# Patient Record
Sex: Male | Born: 1960 | Hispanic: No | Marital: Married | Smoking: Never smoker
Health system: Southern US, Community
[De-identification: ages and names within clinical notes are randomized; demographics above are authoritative.]

---

## 1999-04-03 ENCOUNTER — Encounter: Payer: Self-pay | Admitting: Emergency Medicine

## 1999-04-03 ENCOUNTER — Inpatient Hospital Stay (HOSPITAL_COMMUNITY): Admission: EM | Admit: 1999-04-03 | Discharge: 1999-04-03 | Payer: Self-pay | Admitting: Emergency Medicine

## 1999-11-01 ENCOUNTER — Emergency Department (HOSPITAL_COMMUNITY): Admission: EM | Admit: 1999-11-01 | Discharge: 1999-11-01 | Payer: Self-pay | Admitting: Emergency Medicine

## 1999-11-01 ENCOUNTER — Encounter: Payer: Self-pay | Admitting: Emergency Medicine

## 2011-07-14 ENCOUNTER — Telehealth: Payer: Self-pay | Admitting: *Deleted

## 2011-07-14 NOTE — Telephone Encounter (Signed)
Opened in error

## 2019-02-26 ENCOUNTER — Emergency Department (HOSPITAL_COMMUNITY)
Admission: EM | Admit: 2019-02-26 | Discharge: 2019-02-26 | Disposition: A | Discharge status: Home / Self Care | Payer: Worker's Compensation | Attending: Emergency Medicine | Admitting: Emergency Medicine

## 2019-02-26 ENCOUNTER — Emergency Department (HOSPITAL_COMMUNITY): Payer: Worker's Compensation

## 2019-02-26 ENCOUNTER — Encounter (HOSPITAL_COMMUNITY): Payer: Self-pay

## 2019-02-26 DIAGNOSIS — S0081XA Abrasion of other part of head, initial encounter: Secondary | ICD-10-CM | POA: Insufficient documentation

## 2019-02-26 DIAGNOSIS — Z23 Encounter for immunization: Secondary | ICD-10-CM | POA: Diagnosis not present

## 2019-02-26 DIAGNOSIS — Y999 Unspecified external cause status: Secondary | ICD-10-CM | POA: Insufficient documentation

## 2019-02-26 DIAGNOSIS — Y9389 Activity, other specified: Secondary | ICD-10-CM | POA: Diagnosis not present

## 2019-02-26 DIAGNOSIS — Y92812 Truck as the place of occurrence of the external cause: Secondary | ICD-10-CM | POA: Insufficient documentation

## 2019-02-26 DIAGNOSIS — S42402A Unspecified fracture of lower end of left humerus, initial encounter for closed fracture: Secondary | ICD-10-CM | POA: Diagnosis not present

## 2019-02-26 DIAGNOSIS — W1789XA Other fall from one level to another, initial encounter: Secondary | ICD-10-CM | POA: Diagnosis not present

## 2019-02-26 MED ORDER — TRAMADOL HCL 50 MG PO TABS: 50.0000 mg | ORAL_TABLET | Freq: Four times a day (QID) | ORAL | 0 refills | Status: AC | PRN

## 2019-02-26 MED ORDER — TETANUS-DIPHTH-ACELL PERTUSSIS 5-2.5-18.5 LF-MCG/0.5 IM SUSP
0.5000 mL | Freq: Once | INTRAMUSCULAR | Status: AC
  Administered 2019-02-26: 0.5 mL via INTRAMUSCULAR
  Filled 2019-02-26: qty 0.5

## 2019-02-26 MED ORDER — OXYCODONE-ACETAMINOPHEN 5-325 MG PO TABS
1.0000 | ORAL_TABLET | Freq: Once | ORAL | Status: AC
  Administered 2019-02-26: 1 via ORAL
  Filled 2019-02-26: qty 1

## 2019-02-26 NOTE — Progress Notes (Signed)
Orthopedic Tech Progress Note Patient Details:  Eugene Willis [birth year] [id number]  Ortho Devices Type of Ortho Device: Arm sling, Post (long arm) splint Ortho Device/Splint Location: lue Ortho Device/Splint Interventions: Ordered, Application, Adjustment   Post Interventions Patient Tolerated: Well Instructions Provided: Care of device, Adjustment of device   Trinna Post 02/26/2019, 6:19 AM

## 2019-02-26 NOTE — ED Triage Notes (Signed)
Pt comes via [facility] after a fall out of tractor trailer from appx 6 ft onto his head, abrasions on L side of head, pt refused c-collar, pt was initially hypotensive at 74 systolic palpated, c/o of L arm pain, not on blood thinners, no LOC, PTA received 200 mcg fentanyl

## 2019-02-26 NOTE — ED Provider Notes (Signed)
[facility] EMER[facility]ENC[facility] DEPARTMENT Provider Note   CSN: [id number] Arrival date & time: 02/26/19  0342   History Chief Complaint  Patient presents with  . Fall    Eugene Willis is a 59 y.o. male.  The history is provided by the patient and the EMS personnel.  Fall  He fell getting out of a truck.  The fall was estimated about 6 feet and he landed on his left elbow.  He suffered abrasions to the left side of his face.  There was no loss of consciousness.  He denies other injury.  He does not know when his last tetanus immunization was.  EMS noted hypotension initially with blood pressure 74 systolic.  Patient was pale and lightheaded at that time.  Blood pressure has normalized since then.  He specifically denies neck, back, chest, abdomen injury.   No past medical history on file.  There are no problems to display for this patient.   ** The histories are not reviewed yet. Please review them in the "History" navigator section and refresh this Webb.     No family history on file.  Social History   Tobacco Use  . Smoking status: Not on file  Substance Use Topics  . Alcohol use: Not on file  . Drug use: Not on file    Home Medications Prior to Admission medications   Not on File    Allergies    Patient has no known allergies.  Review of Systems   Review of Systems  All other systems reviewed and are negative.   Physical Exam Updated Vital Signs BP (!) 188/97   Pulse 61   Temp 98.7 F (37.1 C) (Oral)   Resp (!) 21   SpO2 99%   Physical Exam Vitals and nursing note reviewed.   59 year old male, resting comfortably and in no acute distress. Vital signs are significant for elevated blood pressure and borderline elevated respiratory rate. Oxygen saturation is 99%, which is normal. Head is normocephalic.  Abrasions are present in the left infraorbital area and lateral to the left orbit without discrete laceration. PERRLA, EOMI.  Oropharynx is clear.  Neck is nontender without adenopathy or JVD. Back is nontender and there is no CVA tenderness. Lungs are clear without rales, wheezes, or rhonchi. Chest is nontender. Heart has regular rate and rhythm without murmur. Abdomen is soft, flat, nontender without masses or hepatosplenomegaly and peristalsis is normoactive. Pelvis is stable and nontender. Extremities: There is tenderness to palpation in the left elbow and he has pain with extension of the elbow and with pronation/supination.  There is no swelling or deformity seen.  Distal neurovascular exam is intact with strong pulses, prompt capillary refill, normal sensation.  There is no tenderness palpation anywhere else in the left arm and there is full range of motion present in the left shoulder and left wrist.  Full range of motion present all other joints without pain. Skin is warm and dry without rash. Neurologic: Mental status is normal, cranial nerves are intact, there are no motor or sensory deficits.  ED Results / Procedures / Treatments    Radiology DG Elbow Complete Left  Result Date: 02/26/2019 CLINICAL DATA:  Fall. Pain. EXAM: LEFT ELBOW - COMPLETE 3+ VIEW COMPARISON:  None. FINDINGS: Study markedly limited due to patient positioning/inability to extend elbow. Within this limitation, no gross fracture or dislocation is evident. Small cortical fragments adjacent to the medial epicondyle suggest avulsion injury and there is associated overlying  soft tissue swelling. Lateral projection is oblique in position, but demonstrates no fat pad elevation to suggest joint effusion. IMPRESSION: Limited study due to positioning shows small cortical fragments adjacent to the medial epicondyle compatible with avulsion injury. No gross fracture or dislocation. No joint effusion evident although assessment limited by oblique positioning on the lateral film. Electronically Signed   By: Kennith Center M.D.   On: 02/26/2019 04:30    CT Head Wo Contrast  Result Date: 02/26/2019 CLINICAL DATA:  Patient fell 6 feet out of a truck onto his head. EXAM: CT HEAD WITHOUT CONTRAST CT MAXILLOFACIAL WITHOUT CONTRAST CT CERVICAL SPINE WITHOUT CONTRAST TECHNIQUE: Multidetector CT imaging of the head, cervical spine, and maxillofacial structures were performed using the standard protocol without intravenous contrast. Multiplanar CT image reconstructions of the cervical spine and maxillofacial structures were also generated. COMPARISON:  None. FINDINGS: CT HEAD FINDINGS Brain: There is no evidence for acute hemorrhage, hydrocephalus, mass lesion, or abnormal extra-axial fluid collection. No definite CT evidence for acute infarction. Vascular: No hyperdense vessel or unexpected calcification. Skull: Normal. Negative for fracture or focal lesion. Other: None. CT MAXILLOFACIAL FINDINGS Osseous: No fracture or mandibular dislocation. No destructive process. Orbits: Negative. No traumatic or inflammatory finding. Sinuses: Minimal chronic mucosal thickening noted both maxillary sinuses with some fluid visible in the inferior right mastoid air cells. Soft tissues: Negative. CT CERVICAL SPINE FINDINGS Alignment: Normal. Skull base and vertebrae: No acute fracture. No primary bone lesion or focal pathologic process. Soft tissues and spinal canal: No prevertebral fluid or swelling. No visible canal hematoma. Disc levels:  Mild loss of disc height noted C6-7. Upper chest: Negative. Other: None. IMPRESSION: 1. Normal CT evaluation of the brain. 2. No evidence for an acute maxillofacial fracture. 3. No cervical spine fracture. Electronically Signed   By: Kennith Center M.D.   On: 02/26/2019 05:17   CT Cervical Spine Wo Contrast  Result Date: 02/26/2019 CLINICAL DATA:  Patient fell 6 feet out of a truck onto his head. EXAM: CT HEAD WITHOUT CONTRAST CT MAXILLOFACIAL WITHOUT CONTRAST CT CERVICAL SPINE WITHOUT CONTRAST TECHNIQUE: Multidetector CT imaging of the head,  cervical spine, and maxillofacial structures were performed using the standard protocol without intravenous contrast. Multiplanar CT image reconstructions of the cervical spine and maxillofacial structures were also generated. COMPARISON:  None. FINDINGS: CT HEAD FINDINGS Brain: There is no evidence for acute hemorrhage, hydrocephalus, mass lesion, or abnormal extra-axial fluid collection. No definite CT evidence for acute infarction. Vascular: No hyperdense vessel or unexpected calcification. Skull: Normal. Negative for fracture or focal lesion. Other: None. CT MAXILLOFACIAL FINDINGS Osseous: No fracture or mandibular dislocation. No destructive process. Orbits: Negative. No traumatic or inflammatory finding. Sinuses: Minimal chronic mucosal thickening noted both maxillary sinuses with some fluid visible in the inferior right mastoid air cells. Soft tissues: Negative. CT CERVICAL SPINE FINDINGS Alignment: Normal. Skull base and vertebrae: No acute fracture. No primary bone lesion or focal pathologic process. Soft tissues and spinal canal: No prevertebral fluid or swelling. No visible canal hematoma. Disc levels:  Mild loss of disc height noted C6-7. Upper chest: Negative. Other: None. IMPRESSION: 1. Normal CT evaluation of the brain. 2. No evidence for an acute maxillofacial fracture. 3. No cervical spine fracture. Electronically Signed   By: Kennith Center M.D.   On: 02/26/2019 05:17   CT Maxillofacial Wo Contrast  Result Date: 02/26/2019 CLINICAL DATA:  Patient fell 6 feet out of a truck onto his head. EXAM: CT HEAD WITHOUT CONTRAST CT MAXILLOFACIAL WITHOUT  CONTRAST CT CERVICAL SPINE WITHOUT CONTRAST TECHNIQUE: Multidetector CT imaging of the head, cervical spine, and maxillofacial structures were performed using the standard protocol without intravenous contrast. Multiplanar CT image reconstructions of the cervical spine and maxillofacial structures were also generated. COMPARISON:  None. FINDINGS: CT HEAD  FINDINGS Brain: There is no evidence for acute hemorrhage, hydrocephalus, mass lesion, or abnormal extra-axial fluid collection. No definite CT evidence for acute infarction. Vascular: No hyperdense vessel or unexpected calcification. Skull: Normal. Negative for fracture or focal lesion. Other: None. CT MAXILLOFACIAL FINDINGS Osseous: No fracture or mandibular dislocation. No destructive process. Orbits: Negative. No traumatic or inflammatory finding. Sinuses: Minimal chronic mucosal thickening noted both maxillary sinuses with some fluid visible in the inferior right mastoid air cells. Soft tissues: Negative. CT CERVICAL SPINE FINDINGS Alignment: Normal. Skull base and vertebrae: No acute fracture. No primary bone lesion or focal pathologic process. Soft tissues and spinal canal: No prevertebral fluid or swelling. No visible canal hematoma. Disc levels:  Mild loss of disc height noted C6-7. Upper chest: Negative. Other: None. IMPRESSION: 1. Normal CT evaluation of the brain. 2. No evidence for an acute maxillofacial fracture. 3. No cervical spine fracture. Electronically Signed   By: Kennith Center M.D.   On: 02/26/2019 05:17   Procedures .Splint Application  Date/Time: 02/26/2019 5:37 AM Performed by: Dione Booze, MD Authorized by: Dione Booze, MD   Consent:    Consent obtained:  Verbal   Consent given by:  Patient   Risks discussed:  Pain, numbness and swelling   Alternatives discussed:  No treatment Pre-procedure details:    Sensation:  Normal   Skin color:  Normal Procedure details:    Laterality:  Left   Location:  Arm   Arm:  L upper arm and L lower arm   Strapping: no     Splint type:  Long arm   Supplies:  Ortho-Glass, cotton padding and elastic bandage Post-procedure details:    Pain:  Unchanged   Sensation:  Normal   Skin color:  Normal   Patient tolerance of procedure:  Tolerated well, no immediate complications Comments:     Splint was applied by orthopedic technician.  I  evaluated the patient's neurovascular status following splint application.     Medications Ordered in ED Medications  Tdap (BOOSTRIX) injection 0.5 mL (has no administration in time range)    ED Course  I have reviewed the triage vital signs and the nursing notes.  Pertinent imaging results that were available during my care of the patient were reviewed by me and considered in my medical decision making (see chart for details).  MDM Rules/Calculators/A&P Fall from cab of truck with injury to left elbow and facial abrasions.  He will be sent for x-rays of left elbow and CT scans of head, maxillofacial bones, cervical spine.  Tdap booster is given.  Old records are reviewed, and he has no relevant past visits.  CT scans show no significant head, face, neck injury.  X-rays of the left elbow appear to show avulsion fractures of the medial condyle.  He is placed in a long-arm splint and will be referred to orthopedics for follow-up.  He will be discharged with prescription for tramadol.  Final Clinical Impression(s) / ED Diagnoses Final diagnoses:  Fall from stationary vehicle, initial encounter  Abrasion of face, initial encounter  Closed fracture of left elbow, initial encounter    Rx / DC Orders ED Discharge Orders    None  Dione Booze, MD 02/26/19 [phone]

## 2019-02-26 NOTE — Discharge Instructions (Signed)
Wear the splint and use the sling until you see the orthopedic doctor.  Apply ice as needed.  Apply antibiotic ointment to the abraded areas on your face.  Take acetaminophen or ibuprofen as needed for pain. For more severe pain, you can take tramadol.

## 2019-02-28 ENCOUNTER — Other Ambulatory Visit: Payer: Self-pay

## 2019-02-28 ENCOUNTER — Encounter: Payer: Self-pay | Admitting: Orthopaedic Surgery

## 2019-02-28 ENCOUNTER — Ambulatory Visit: Payer: Managed Care, Other (non HMO) | Admitting: Orthopaedic Surgery

## 2019-02-28 DIAGNOSIS — S5002XA Contusion of left elbow, initial encounter: Secondary | ICD-10-CM | POA: Diagnosis not present

## 2019-02-28 NOTE — Progress Notes (Signed)
Office Visit Note   Patient: Eugene Willis           Date of Birth: [birth year]           MRN: [id number] Visit Date: 02/28/2019              Requested by: No referring provider defined for this encounter. PCP: Patient, No Pcp Per   Assessment & Plan: Visit Diagnoses:  1. Contusion of left elbow, initial encounter     Plan: Given his x-ray findings and clinical exam findings he can be out of the splint but I would like him to still use a sling.  He can gently work on range of motion of his elbow.  I would like to see him back next week for repeat 3 views of the left elbow.  He can work on ice and elevation for swelling.  I will need to keep him out of work until further notice.  All question concerns were answered and addressed.  Follow-Up Instructions: Return in about 8 days (around 03/08/2019).   Orders:  No orders of the defined types were placed in this encounter.  No orders of the defined types were placed in this encounter.     Procedures: No procedures performed   Clinical Data: No additional findings.   Subjective: Chief Complaint  Patient presents with  . Left Elbow - Fracture  The patient is a very pleasant right-hand-dominant 59 year old gentleman who was referred for emergency room after slipping off the second step of a truck landing on his left elbow and injuring his scalp and head.  He was found to have some type of elbow injury so the ER staff appropriately placed him in a splint and is following up with our office today.  He denies any numbness and tingling in his hand but does report elbow pain and swelling.  He has a family member with him today to help with interpreting although he does speak pretty good broken [place].  I was able to review the chart from emergency room and see pictures and x-rays as well.  He had significant contusions and abrasions to his head.  He is never injured this arm before or had surgery on his arm.  HPI  Review of Systems  He currently denies any shortness of breath, he denies any chest pain, fever, chills, nausea, vomiting  Objective: Vital Signs: There were no vitals taken for this visit.  Physical Exam He is alert and orient x3 and in no acute distress Ortho Exam Examination of his left elbow out of the splint shows significant bruising and swelling globally around the elbow.  It is clinically well located but painful throughout its arc of motion.  His shoulder and hand exam showed no gross findings other than swelling around the hand from being in a splint for a few days. Specialty Comments:  No specialty comments available.  Imaging: No results found. X-rays on the canopy system of the elbow elbow show questionable small avulsions off of the medial epicondyle of the elbow but the elbow is located.  These cortical irregularities are very small.  PMFS History: There are no problems to display for this patient.  History reviewed. No pertinent past medical history.  History reviewed. No pertinent family history.  History reviewed. No pertinent surgical history. Social History   Occupational History  . Not on file  Tobacco Use  . Smoking status: Never Smoker  . Smokeless tobacco: Never Used  Substance and Sexual Activity  .  Alcohol use: Never  . Drug use: Never  . Sexual activity: Not on file

## 2019-03-07 ENCOUNTER — Other Ambulatory Visit: Payer: Self-pay | Admitting: Physician Assistant

## 2019-03-07 ENCOUNTER — Telehealth: Payer: Self-pay | Admitting: Orthopaedic Surgery

## 2019-03-07 MED ORDER — HYDROCODONE-ACETAMINOPHEN 5-325 MG PO TABS: 1.0000 | ORAL_TABLET | ORAL | 0 refills | Status: AC | PRN

## 2019-03-07 NOTE — Telephone Encounter (Signed)
Please advise 

## 2019-03-07 NOTE — Telephone Encounter (Signed)
Patient's daughter called.   They are requesting a stronger prescription for the patient. She says they need a higher dosage to hold them until the appointment as her father is in extreme pain   Call back number: [phone]

## 2019-03-07 NOTE — Telephone Encounter (Signed)
Patient called  He need a prescription for TraMADol   Call back number: [phone]

## 2019-03-07 NOTE — Telephone Encounter (Signed)
Can you please advise I sent a message earlier  to [facility] for him to see tomorrow but patient states he can't wait that long, he is in pain Please advise His daughter ask's that if we don't change it, can we atleast refill his Tramadol

## 2019-03-08 ENCOUNTER — Ambulatory Visit: Payer: Managed Care, Other (non HMO) | Admitting: Physician Assistant

## 2020-08-11 IMAGING — CT CT CERVICAL SPINE W/O CM
3 of 4 series · 12 of 33 positions shown, 14 images · non-contrast
Comparison: None.

CLINICAL DATA: Patient fell 6 feet out of a truck onto his head.

EXAM:
CT HEAD WITHOUT CONTRAST
CT MAXILLOFACIAL WITHOUT CONTRAST
CT CERVICAL SPINE WITHOUT CONTRAST
TECHNIQUE: Multidetector CT imaging of the head, cervical spine, and
maxillofacial structures were performed using the standard protocol
without intravenous contrast. Multiplanar CT image reconstructions
of the cervical spine and maxillofacial structures were also
generated.

[Series 8: sag bone · sagittal · 0.37mm/px · 5 of 120 slices shown, 6 images]
[im 40/120  bone]
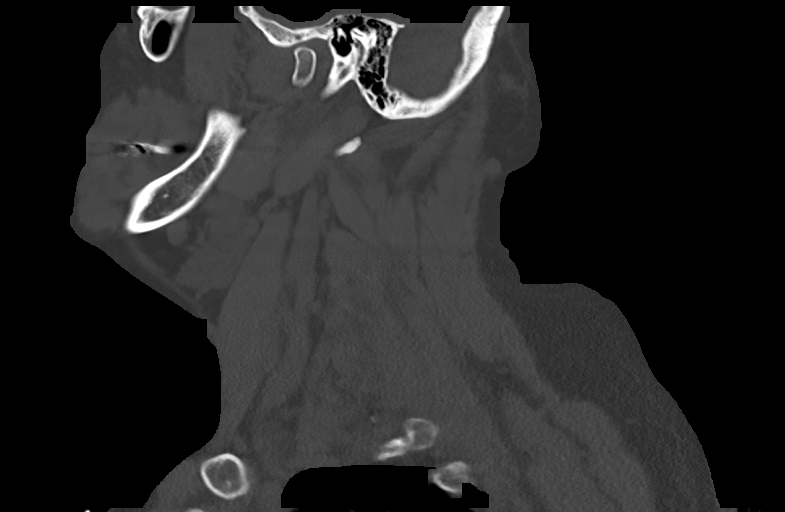
[im 50/120  bone]
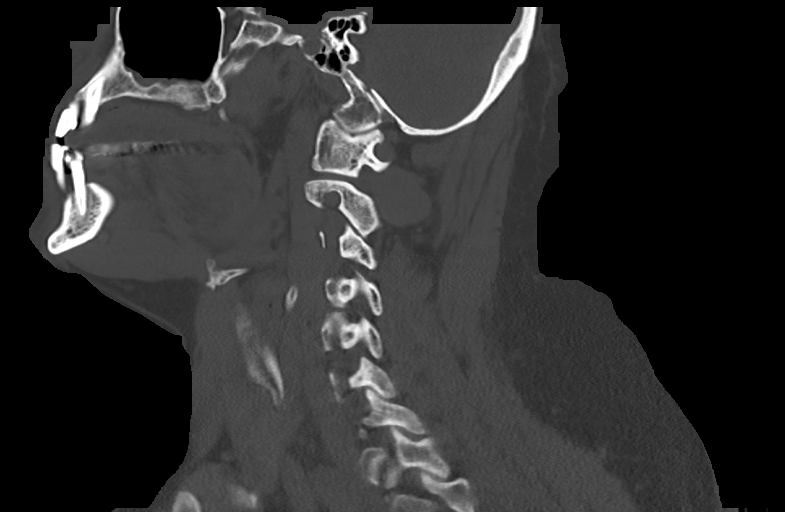
[im 60/120  soft-tissue]
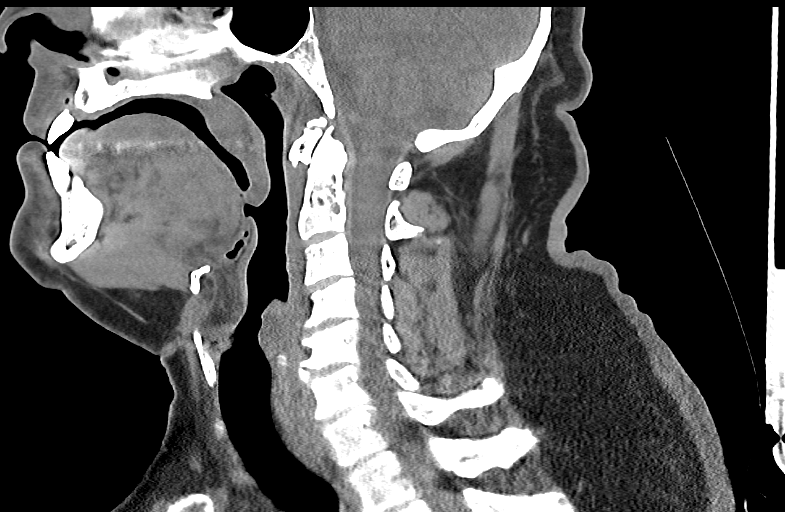
[im 60/120  bone]
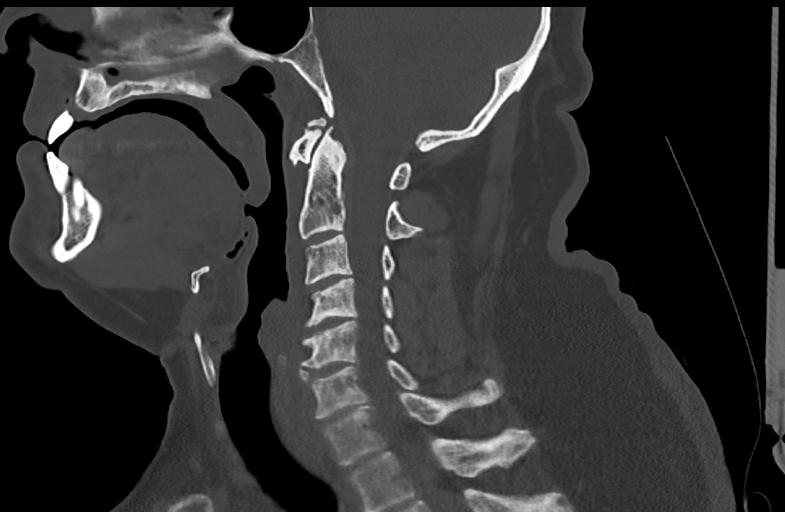
[im 70/120  bone]
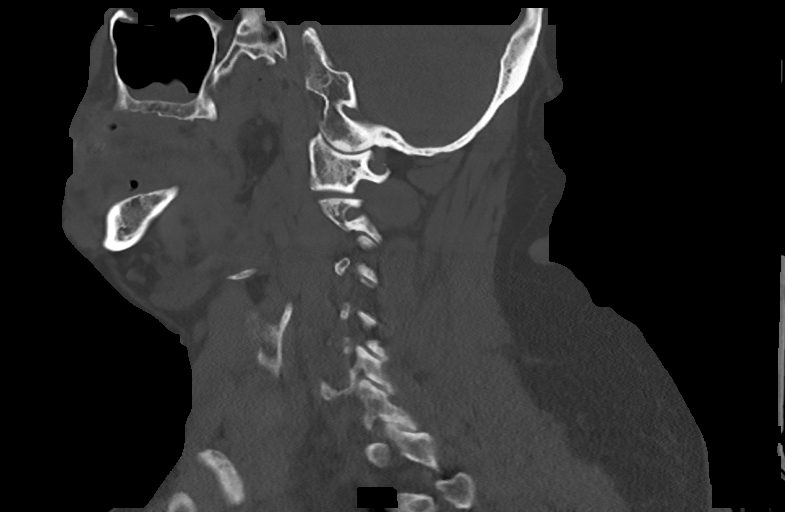
[im 80/120  bone]
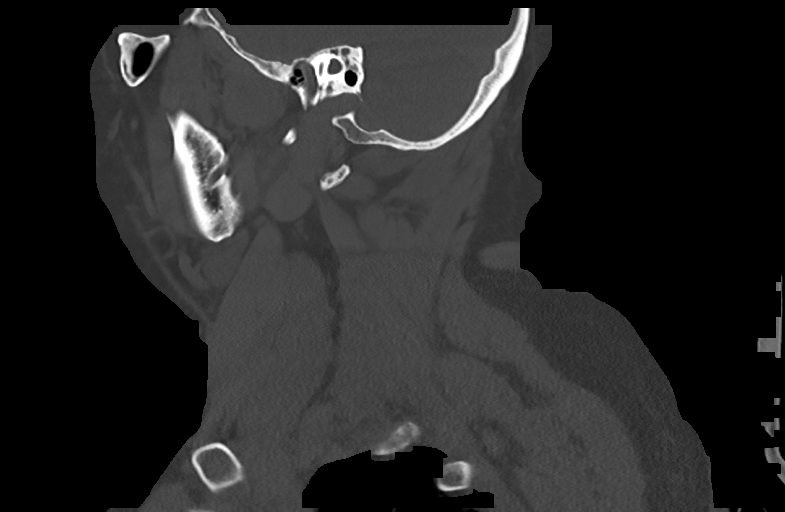

[Series 9: cor bone · coronal · 0.34mm/px · 3 of 76 slices shown]
[im 16/76  bone]
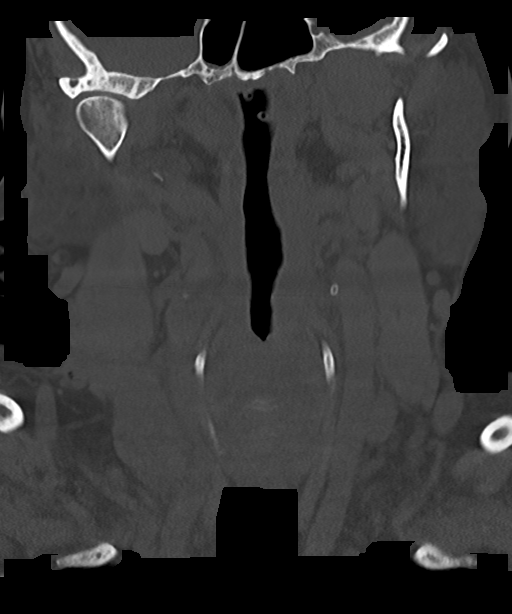
[im 31/76  bone]
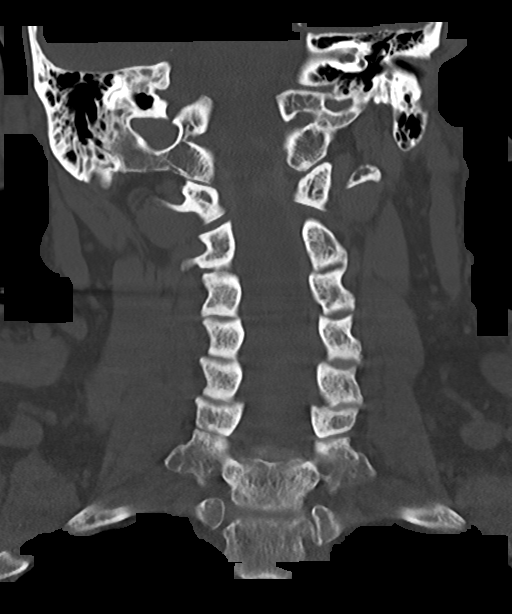
[im 46/76  bone]
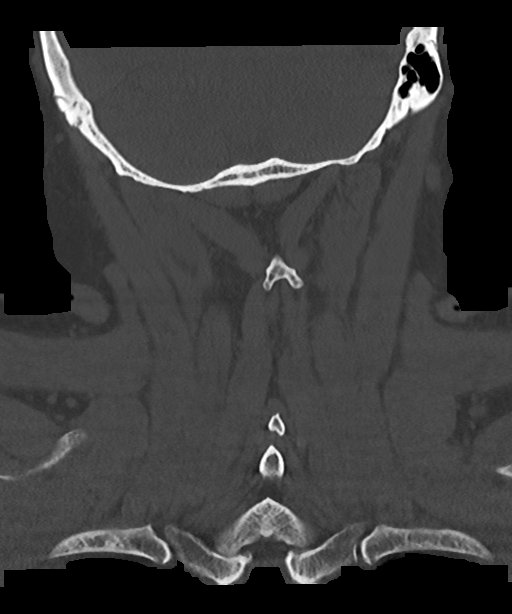

[Series 10: orthogonal axials · axial · 0.21mm/px · z∈[-210,-109]mm · 4 of 83 slices shown, 5 images]
[im 14/83  soft-tissue]
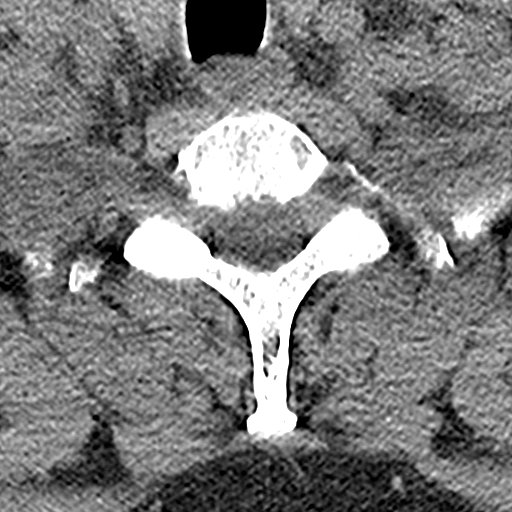
[im 14/83  bone]
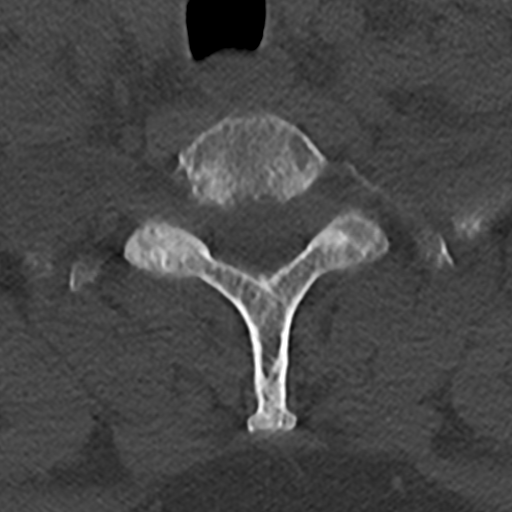
[im 28/83  bone]
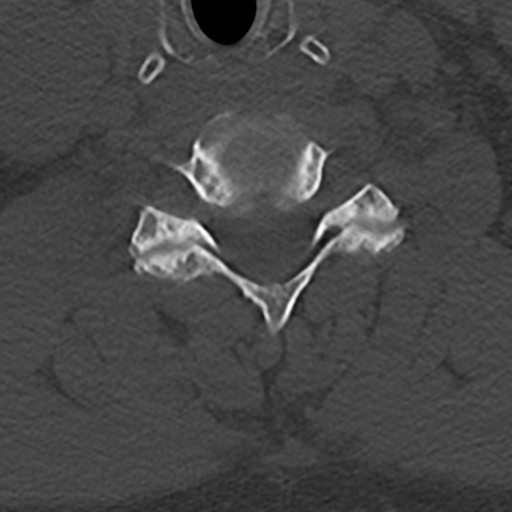
[im 55/83  bone]
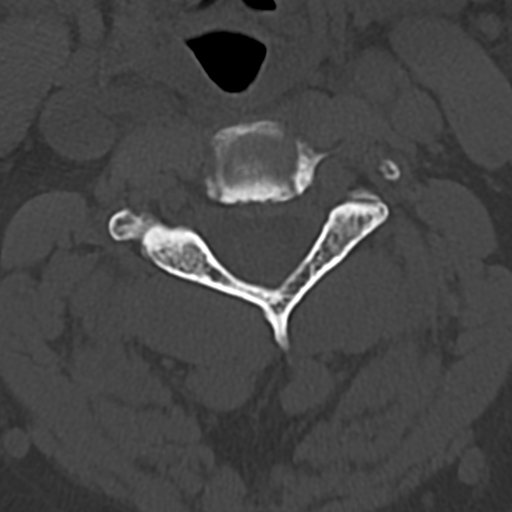
[im 69/83  bone]
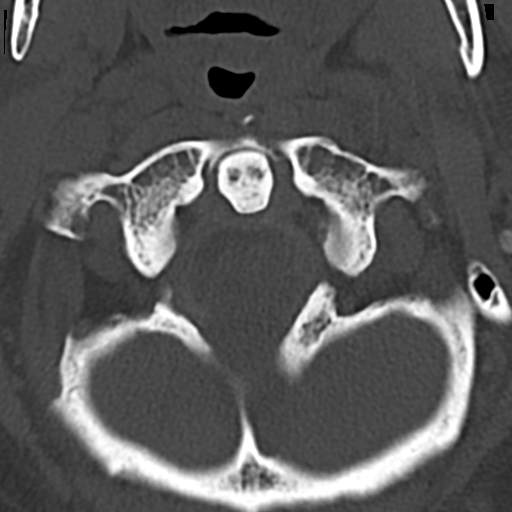

[12 of 33 positions shown; findings below may reference images not displayed]

FINDINGS: CT HEAD FINDINGS

Brain: There is no evidence for acute hemorrhage, hydrocephalus,
mass lesion, or abnormal extra-axial fluid collection. No definite
CT evidence for acute infarction.

Vascular: No hyperdense vessel or unexpected calcification.

Skull: Normal. Negative for fracture or focal lesion.

Other: None.

CT MAXILLOFACIAL FINDINGS

Osseous: No fracture or mandibular dislocation. No destructive
process.

Orbits: Negative. No traumatic or inflammatory finding.

Sinuses: Minimal chronic mucosal thickening noted both maxillary
sinuses with some fluid visible in the inferior right mastoid air
cells.

Soft tissues: Negative.

CT CERVICAL SPINE FINDINGS

Alignment: Normal.

Skull base and vertebrae: No acute fracture. No primary bone lesion
or focal pathologic process.

Soft tissues and spinal canal: No prevertebral fluid or swelling. No
visible canal hematoma.

Disc levels:  Mild loss of disc height noted C6-7.

Upper chest: Negative.

Other: None.
IMPRESSION: 1. Normal CT evaluation of the brain.
2. No evidence for an acute maxillofacial fracture.
3. No cervical spine fracture.

## 2020-08-11 IMAGING — CT CT MAXILLOFACIAL W/O CM
3 of 6 series · 15 of 47 positions shown, 18 images · non-contrast
Comparison: None.

CLINICAL DATA: Patient fell 6 feet out of a truck onto his head.

EXAM:
CT HEAD WITHOUT CONTRAST
CT MAXILLOFACIAL WITHOUT CONTRAST
CT CERVICAL SPINE WITHOUT CONTRAST
TECHNIQUE: Multidetector CT imaging of the head, cervical spine, and
maxillofacial structures were performed using the standard protocol
without intravenous contrast. Multiplanar CT image reconstructions
of the cervical spine and maxillofacial structures were also
generated.

[Series 3: maxilllofacial 2.0 hr40 3 · axial · 0.38mm/px · z∈[-139,+9]mm · 10 of 88 slices shown, 13 images]
[im 7/88  brain]
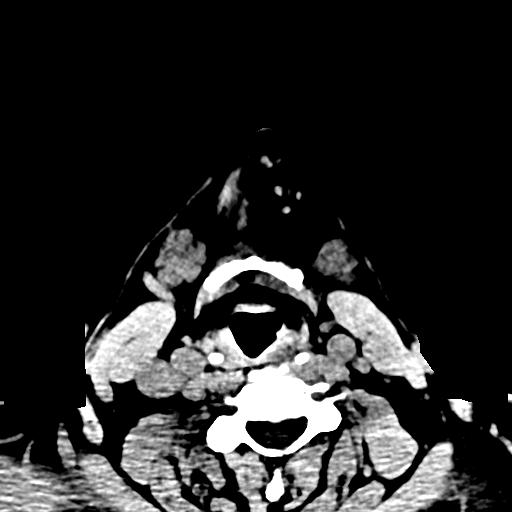
[im 7/88  bone]
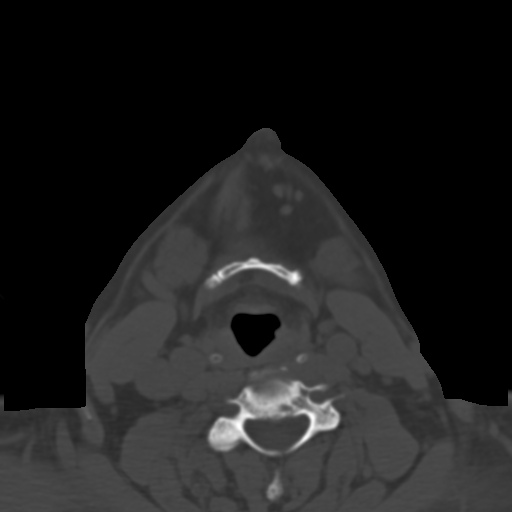
[im 13/88  bone]
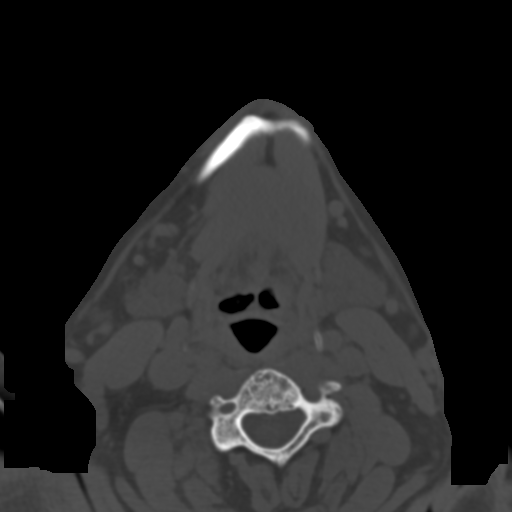
[im 25/88  bone]
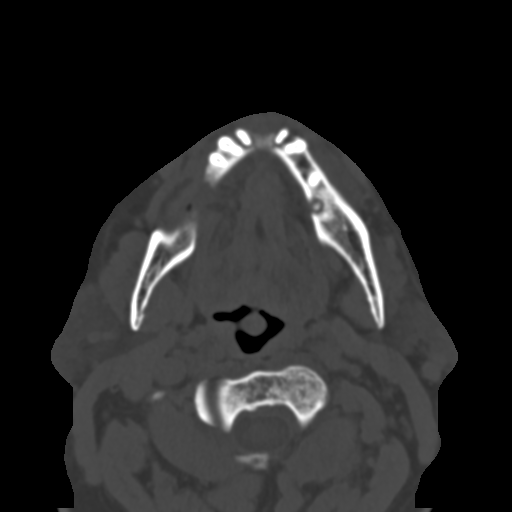
[im 32/88  bone]
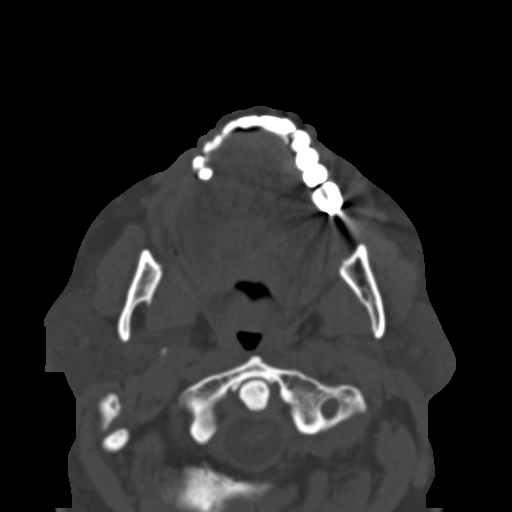
[im 38/88  brain]
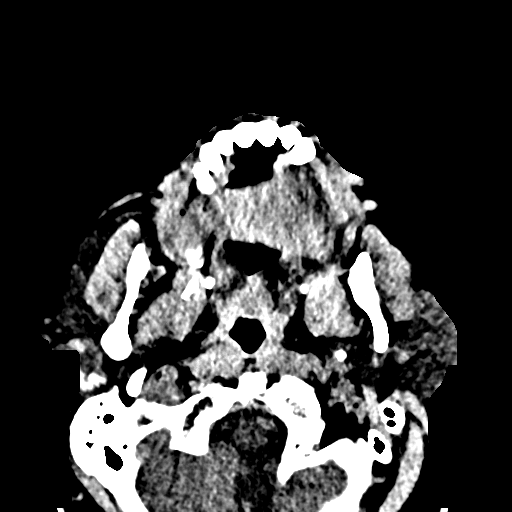
[im 38/88  bone]
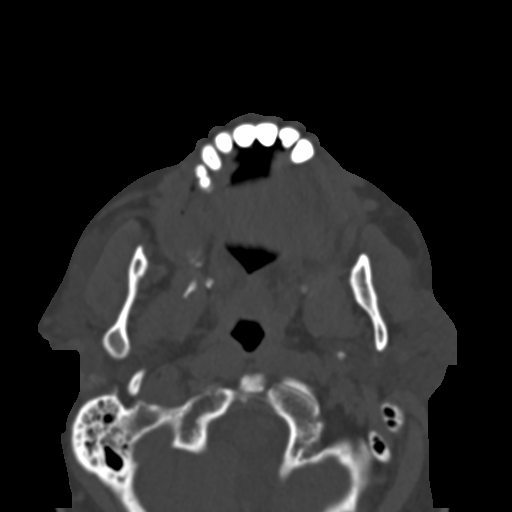
[im 50/88  bone]
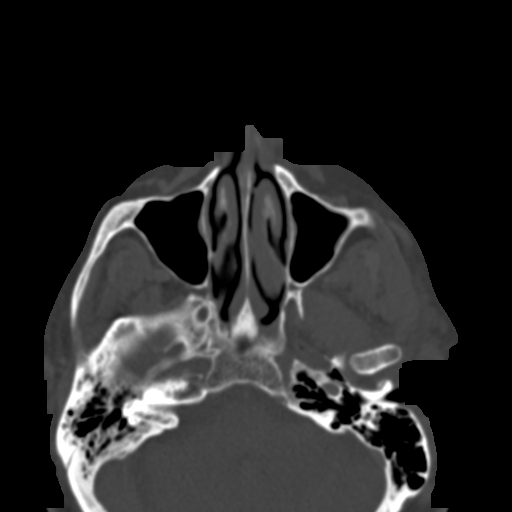
[im 56/88  bone]
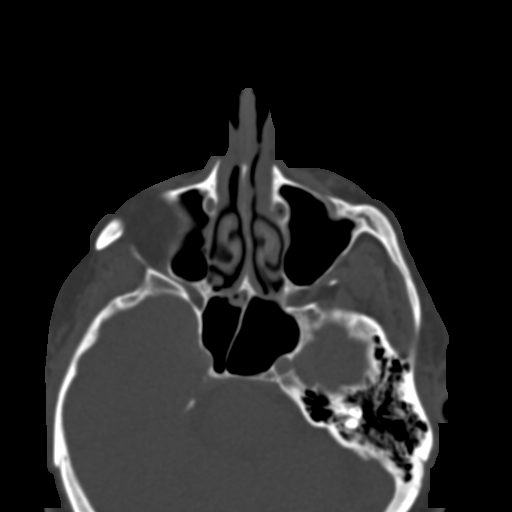
[im 63/88  bone]
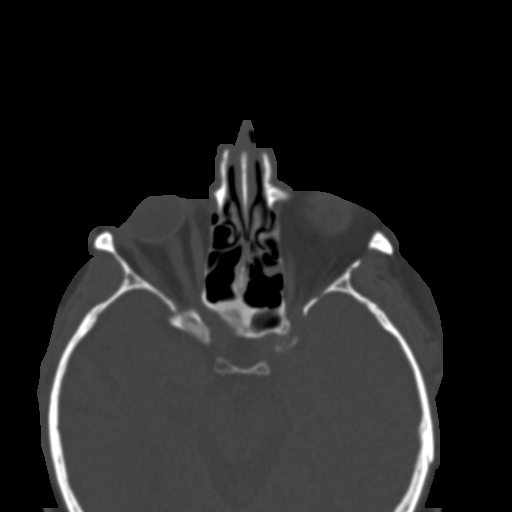
[im 75/88  brain]
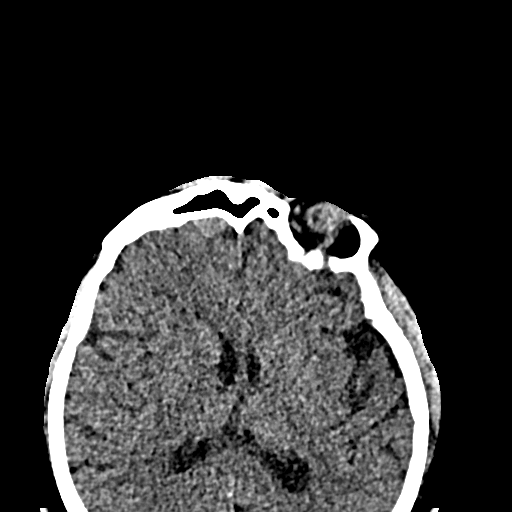
[im 75/88  bone]
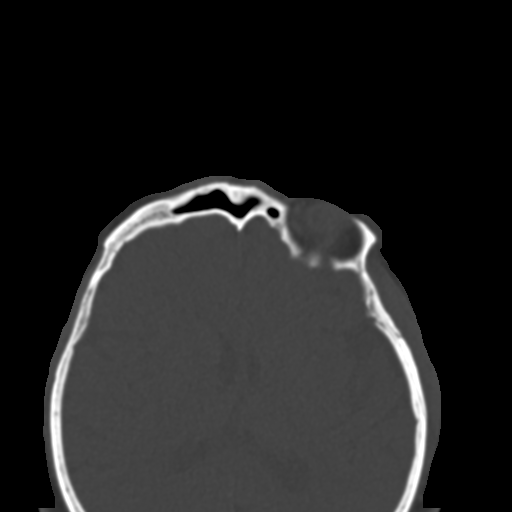
[im 81/88  bone]
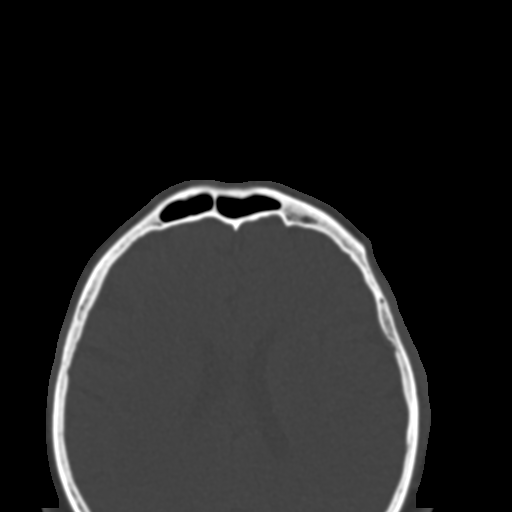

[Series 7: st cor · coronal · 0.35mm/px · 3 of 125 slices shown]
[im 32/125  bone]
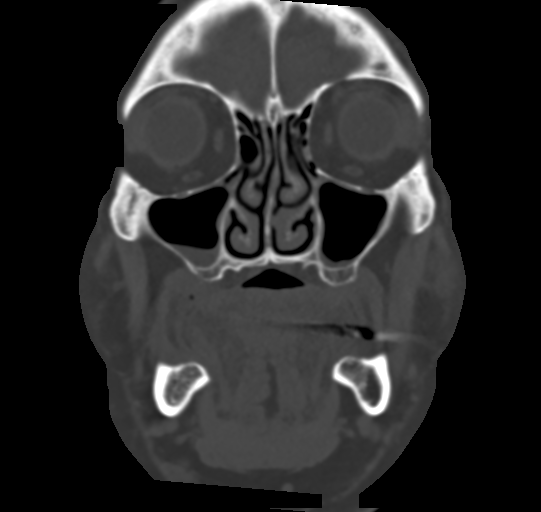
[im 63/125  bone]
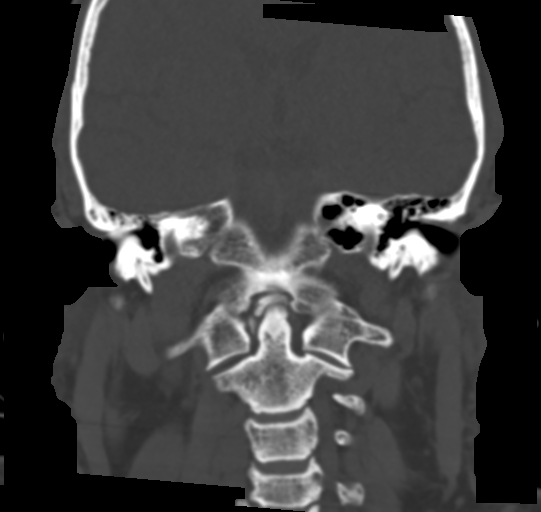
[im 94/125  bone]
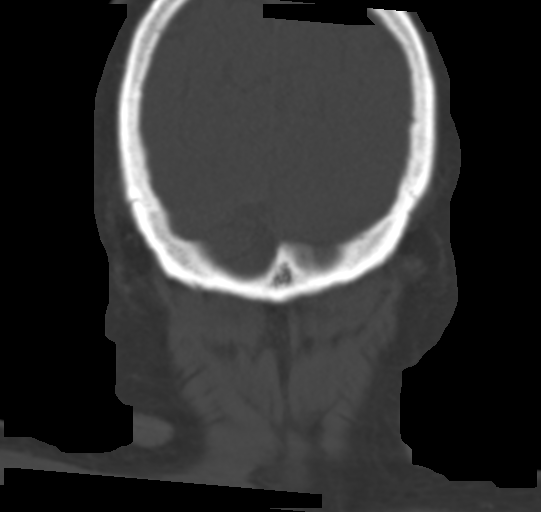

[Series 10: bone sag · sagittal · 0.35mm/px · 2 of 95 slices shown]
[im 32/95  bone]
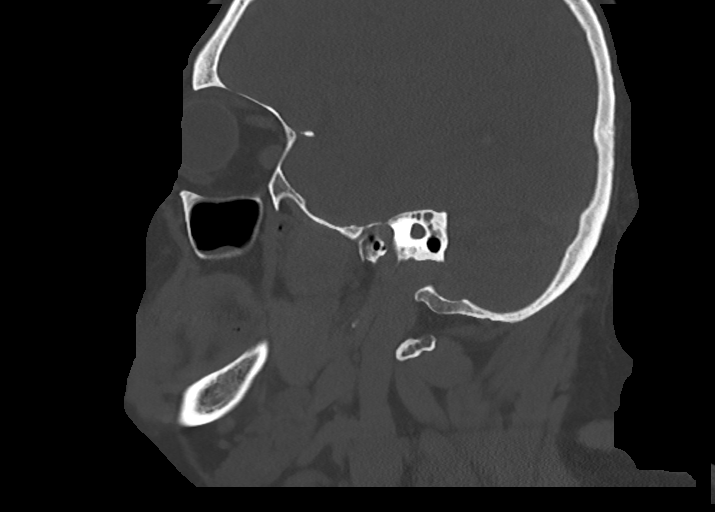
[im 63/95  bone]
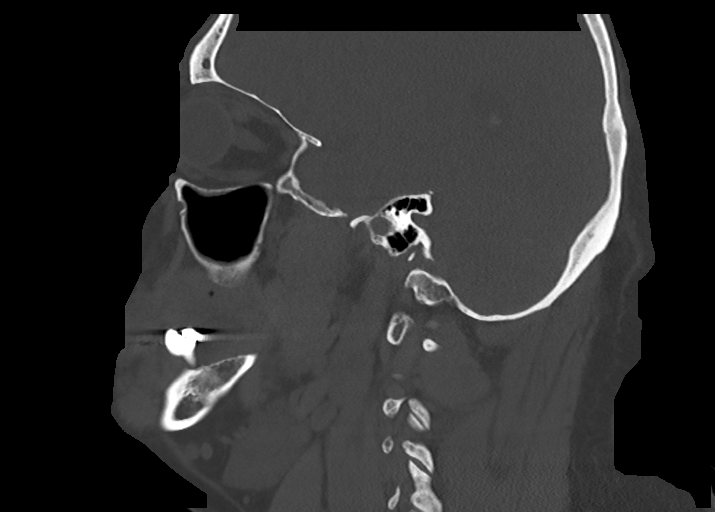

[15 of 47 positions shown; findings below may reference images not displayed]

FINDINGS: CT HEAD FINDINGS

Brain: There is no evidence for acute hemorrhage, hydrocephalus,
mass lesion, or abnormal extra-axial fluid collection. No definite
CT evidence for acute infarction.

Vascular: No hyperdense vessel or unexpected calcification.

Skull: Normal. Negative for fracture or focal lesion.

Other: None.

CT MAXILLOFACIAL FINDINGS

Osseous: No fracture or mandibular dislocation. No destructive
process.

Orbits: Negative. No traumatic or inflammatory finding.

Sinuses: Minimal chronic mucosal thickening noted both maxillary
sinuses with some fluid visible in the inferior right mastoid air
cells.

Soft tissues: Negative.

CT CERVICAL SPINE FINDINGS

Alignment: Normal.

Skull base and vertebrae: No acute fracture. No primary bone lesion
or focal pathologic process.

Soft tissues and spinal canal: No prevertebral fluid or swelling. No
visible canal hematoma.

Disc levels:  Mild loss of disc height noted C6-7.

Upper chest: Negative.

Other: None.
IMPRESSION: 1. Normal CT evaluation of the brain.
2. No evidence for an acute maxillofacial fracture.
3. No cervical spine fracture.
